# Patient Record
Sex: Male | Born: 1935 | Race: White | Hispanic: No | Marital: Single | Smoking: Never smoker
Health system: Southern US, Community
[De-identification: ages and names within clinical notes are randomized; demographics above are authoritative.]

## PROBLEM LIST (undated history)

## (undated) DIAGNOSIS — M1990 Unspecified osteoarthritis, unspecified site: Secondary | ICD-10-CM

## (undated) DIAGNOSIS — F0280 Dementia in other diseases classified elsewhere without behavioral disturbance: Secondary | ICD-10-CM

## (undated) DIAGNOSIS — F419 Anxiety disorder, unspecified: Secondary | ICD-10-CM

## (undated) DIAGNOSIS — I10 Essential (primary) hypertension: Secondary | ICD-10-CM

## (undated) DIAGNOSIS — E039 Hypothyroidism, unspecified: Secondary | ICD-10-CM

## (undated) DIAGNOSIS — G309 Alzheimer's disease, unspecified: Secondary | ICD-10-CM

## (undated) HISTORY — DX: Anxiety disorder, unspecified: F41.9

## (undated) HISTORY — DX: Alzheimer's disease, unspecified: G30.9

## (undated) HISTORY — DX: Hypothyroidism, unspecified: E03.9

## (undated) HISTORY — DX: Unspecified osteoarthritis, unspecified site: M19.90

## (undated) HISTORY — DX: Dementia in other diseases classified elsewhere, unspecified severity, without behavioral disturbance, psychotic disturbance, mood disturbance, and anxiety: F02.80

## (undated) HISTORY — DX: Essential (primary) hypertension: I10

---

## 2003-12-02 ENCOUNTER — Other Ambulatory Visit: Payer: Self-pay

## 2003-12-03 ENCOUNTER — Other Ambulatory Visit: Payer: Self-pay

## 2003-12-18 ENCOUNTER — Other Ambulatory Visit: Payer: Self-pay

## 2007-03-22 ENCOUNTER — Other Ambulatory Visit: Payer: Self-pay

## 2007-03-22 ENCOUNTER — Emergency Department: Payer: Self-pay | Admitting: Emergency Medicine

## 2007-03-28 ENCOUNTER — Other Ambulatory Visit: Payer: Self-pay

## 2007-03-28 ENCOUNTER — Inpatient Hospital Stay: Payer: Self-pay | Admitting: Internal Medicine

## 2010-08-16 ENCOUNTER — Inpatient Hospital Stay: Payer: Self-pay | Admitting: Internal Medicine

## 2010-08-16 ENCOUNTER — Ambulatory Visit: Payer: Self-pay | Admitting: Cardiovascular Disease

## 2010-08-17 ENCOUNTER — Encounter: Payer: Self-pay | Admitting: Cardiovascular Disease

## 2010-08-18 ENCOUNTER — Encounter: Payer: Self-pay | Admitting: Cardiovascular Disease

## 2010-08-31 ENCOUNTER — Emergency Department: Payer: Self-pay | Admitting: Emergency Medicine

## 2010-09-05 ENCOUNTER — Ambulatory Visit: Payer: Self-pay | Admitting: Cardiovascular Disease

## 2010-09-05 ENCOUNTER — Encounter: Payer: Self-pay | Admitting: Cardiovascular Disease

## 2010-09-05 DIAGNOSIS — I10 Essential (primary) hypertension: Secondary | ICD-10-CM

## 2010-09-05 DIAGNOSIS — R609 Edema, unspecified: Secondary | ICD-10-CM | POA: Insufficient documentation

## 2010-09-05 DIAGNOSIS — I4589 Other specified conduction disorders: Secondary | ICD-10-CM

## 2010-09-06 ENCOUNTER — Encounter: Payer: Self-pay | Admitting: Cardiovascular Disease

## 2010-09-20 ENCOUNTER — Encounter: Payer: Self-pay | Admitting: Cardiovascular Disease

## 2010-10-29 NOTE — Letter (Signed)
Summary: Discharge Summary  Discharge Summary   Imported By: [place] Carbo 08/23/2010 09:17:13  _____________________________________________________________________  External Attachment:    Type:   Image     Comment:   External Document  Appended Document: NP6/AMD Given severe LE edema, would hold amlodipine and start hydralazine 25 mg by mouth three times a day. [facility] [phone]. Monitor BP with check x 1 every day for two weeks. Fax numbers back to [place]

## 2010-10-31 NOTE — Letter (Signed)
Summary: BP Readings  BP Readings   Imported By: Harlon Flor 10/02/2010 15:21:33  _____________________________________________________________________  External Attachment:    Type:   Image     Comment:   External Document

## 2010-10-31 NOTE — Letter (Signed)
Summary: [place] [facility]   [place] [facility]   Imported By: Marylou Mccoy 09/27/2010 18:48:36  _____________________________________________________________________  External Attachment:    Type:   Image     Comment:   External Document

## 2010-10-31 NOTE — Letter (Signed)
Summary: Patient Receipt of Notice of Privacy Practices   Patient Receipt of Notice of Privacy Practices   Imported By: Marylou Mccoy 09/27/2010 18:54:53  _____________________________________________________________________  External Attachment:    Type:   Image     Comment:   External Document

## 2010-10-31 NOTE — Assessment & Plan Note (Signed)
Summary: NP6/AMD   Visit Type:  Initial Consult Primary Provider:  Dr. Dewaine Oats  CC:  f/u from [facility]. Denies chest pain, SOB, and and palpitations.  History of Present Illness: 77 year old gentleman with history of Alzheimer's dementia,hypertension, edema, anxiety, renal insufficiency, hard of hearing, presenting to [facility] August 16 2010 for bradycardia, weakness and lethargy with altered mental status.  His symptoms were felt secondary to being on the metoprolol. The metoprolol was held and his heart rate did improve. He was noted to be in a junctional rhythm, sometimes appearing to be a second-degree AV block. He was discharged after he was felt to be stable. He had no symptoms at the time of discharge. He presents today with his caretaker and reports that he does not have any problems. He denies any dizziness, lightheadedness. No falls.  Echocardiogram November 19 shows normal systolic function, normal left and right atria, normal RV function, overall a normal study  EKG today shows asinus rhythm with A-V dissociation,  junctional rhythm with ventricular rate 59 beats are minute, narrow complex  Current Medications (verified): 1)  Amlodipine Besylate 10 Mg Tabs (Amlodipine Besylate) .Marland Kitchen.. 1 Tablet Once Daily 2)  Torsemide 50 Mg/31ml Soln (Torsemide) .Marland Kitchen.. 1 Tablet Once Daily 3)  Synthroid 50 Mcg Tabs (Levothyroxine Sodium) .Marland Kitchen.. 1 Tablet Daily 4)  Loratadine 10 Mg Tabs (Loratadine) .Marland Kitchen.. 1 Tablet Daily 5)  Natures Tears  Soln (Artificial Tear Solution) .Marland Kitchen.. 1 Drop in Each Eye Two Times A Day 6)  Klonopin 1 Mg Tabs (Clonazepam) .Marland Kitchen.. 1 Tablet At Bedtime 7)  Lactose Fast Acting Relief 9000 Unit Tabs (Lactase) .Marland Kitchen.. 1 Tablet in Am 8)  Boost Plus  Liqd (Nutritional Supplements) .... As Needed When Not Eating Well 9)  Ted Hose  Allergies (verified): No Known Drug Allergies  Past History:  Past Medical History: Last updated: 09/05/2010 Alzheimer's  Disease Anxiety Arthritis Hypertension Hypothyroidism  Family History: Last updated: 09/05/2010 Unknown  Social History: Last updated: 09/05/2010 Retired  Single  Tobacco Use - No.  Alcohol Use - no Regular Exercise - no Drug Use - no  Risk Factors: Exercise: no (09/05/2010)  Risk Factors: Smoking Status: never (09/05/2010)  Past Surgical History: Unknown  Family History: Unknown  Review of Systems  The patient denies fever, weight loss, weight gain, vision loss, decreased hearing, hoarseness, chest pain, syncope, dyspnea on exertion, peripheral edema, prolonged cough, abdominal pain, incontinence, muscle weakness, depression, and enlarged lymph nodes.    Vital Signs:  Patient profile:   77 year old male Height:      69 inches Weight:      194.50 pounds BMI:     28.83 Pulse rate:   59 / minute BP sitting:   128 / 72  (left arm) Cuff size:   regular  Vitals Entered By: Lysbeth Galas CMA (September 05, 2010 3:51 PM)  Physical Exam  General:  elderly gentleman who is very hard of hearing and a poor historian. Head:  normocephalic and atraumatic Neck:  Neck supple, no JVD. No masses, thyromegaly or abnormal cervical nodes. Lungs:  Clear bilaterally to auscultation and percussion. Heart:  Non-displaced PMI, chest non-tender; regular rate and rhythm, S1, S2 without murmurs, rubs or gallops. Carotid upstroke normal, no bruit.  Pedals normal pulses. 2+, woody-type edema with signs of chronic venous stasis, small ulcers on the back of the right leg that are healing, , no varicosities. Abdomen:  Bowel sounds positive; abdomen soft and non-tender without masses Msk:  Back  normal, normal gait. Muscle strength and tone normal. Pulses:  pulses normal in all 4 extremities Extremities:  No clubbing or cyanosis. Neurologic:  Alert and oriented x 3. Skin:  Intact without lesions or rashes. Psych:  Normal affect.   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 We will show the EKGs to our electrophysiologist. I suspect that we will continue to monitor him closely and if he has additional symptoms, we could perform a Holter monitor.  His updated medication list for this problem includes:    Amlodipine Besylate 10 Mg Tabs (Amlodipine besylate) .Marland Kitchen... 1 tablet once daily  Problem # 2:  EDEMA (ICD-782.3) His edema is profound. I suggested he wear TED hose bilaterally when awake. I would like to wean him off his amlodipine given the complications of calcium channel blockers and edema though the options for alternate blood pressures are few. I do not want to start any ACE inhibitor or ARB given his renal insufficiency. We could start vomiting no there are rare cases of bradycardia. We could change this to hydralazine as he does live in an assisted facility and they could dose the medication for him. I would probably start 25 mg t.i.d. with titration if needed, and hold his amlodipine.  Problem # 3:  HYPERTENSION, BENIGN (ICD-401.1) blood pressure is well controlled. In an effort to decrease his edema, we will change the amlodipine to hydralazine.  His updated medication list for this problem includes:    Amlodipine Besylate 10 Mg Tabs (Amlodipine besylate) .Marland Kitchen... 1 tablet once daily    Torsemide 50 Mg/105ml Soln (Torsemide) .Marland Kitchen... 1 tablet once daily  Appended Document: NP6/AMD Given severe LE edema, would hold amlodipine and start hydralazine 25 mg by mouth three times a day. [facility] [phone]. Monitor BP with check x 1 every day for two weeks. Fax numbers back to [place]  Appended Document: NP6/AMD Spoke to pt at pt's assisted living facility. Notified of new orders, faxed orders as above to [phone]   Clinical Lists Changes  Medications: Removed medication of AMLODIPINE BESYLATE 10 MG TABS (AMLODIPINE BESYLATE) 1 tablet once daily Added new  medication of HYDRALAZINE HCL 25 MG TABS (HYDRALAZINE HCL) Take one tablet by mouth three times a day

## 2010-10-31 NOTE — Consult Note (Signed)
[facility]   Im[facility] By: Marylou Mccoy 09/27/2010 18:52:49  _____________________________________________________________________  External Attachment:    Type:   Image     Comment:   External Document

## 2010-10-31 NOTE — Miscellaneous (Signed)
Summary: Order  Order   Imported By: Harlon Flor 09/12/2010 14:56:56  _____________________________________________________________________  External Attachment:    Type:   Image     Comment:   External Document

## 2010-10-31 NOTE — Letter (Signed)
[facility]   Imported By: Marylou Mccoy 09/27/2010 18:49:35  _____________________________________________________________________  External Attachment:    Type:   Image     Comment:   External Document

## 2010-12-09 ENCOUNTER — Ambulatory Visit (INDEPENDENT_AMBULATORY_CARE_PROVIDER_SITE_OTHER): Payer: Medicare Other | Admitting: Cardiovascular Disease

## 2010-12-09 ENCOUNTER — Ambulatory Visit: Payer: Self-pay | Admitting: Cardiovascular Disease

## 2010-12-09 ENCOUNTER — Encounter: Payer: Self-pay | Admitting: Cardiovascular Disease

## 2010-12-09 DIAGNOSIS — I10 Essential (primary) hypertension: Secondary | ICD-10-CM

## 2010-12-09 DIAGNOSIS — I498 Other specified cardiac arrhythmias: Secondary | ICD-10-CM

## 2010-12-17 NOTE — Assessment & Plan Note (Signed)
Summary: 3 MONTH F/U/SAB   Visit Type:  Follow-up Primary Provider:  Dr. Dewaine Oats  CC:  3 month F/U. "Doing well.".  History of Present Illness: 77 year old gentleman with history of Alzheimer's dementia,hypertension, edema, anxiety, renal insufficiency, hard of hearing, presenting to [facility] August 16, 2010 for bradycardia, weakness and lethargy with altered mental status. His metoprolol was held. He returns for routine followup.  in the hospital, he was noted to be in a junctional rhythm, sometimes appearing to be a second-degree AV block. He was discharged after he was felt to be stable. He had no symptoms at the time of discharge.   He presents today with his caretaker and reports that he does not have any problems. He denies any dizziness, lightheadedness. No falls.he does not need a cane or walker.  Echocardiogram August 17 2010 shows normal systolic function, normal left and right atria, normal RV function, overall a normal study  EKG today shows sinus rhythm with second degree AV block1, unable to definitively exclude A-V dissociation with  junctional rhythm with ventricular rate 68 beats are minute  Current Medications (verified): 1)  Torsemide 50 Mg/25ml Soln (Torsemide) .Marland Kitchen.. 1 Tablet Once Daily 2)  Synthroid 50 Mcg Tabs (Levothyroxine Sodium) .Marland Kitchen.. 1 Tablet Daily 3)  Loratadine 10 Mg Tabs (Loratadine) .Marland Kitchen.. 1 Tablet Daily 4)  Natures Tears  Soln (Artificial Tear Solution) .Marland Kitchen.. 1 Drop in Each Eye Two Times A Day 5)  Klonopin 1 Mg Tabs (Clonazepam) .Marland Kitchen.. 1 Tablet At Bedtime 6)  Lactose Fast Acting Relief 9000 Unit Tabs (Lactase) .Marland Kitchen.. 1 Tablet in Am 7)  Boost Plus  Liqd (Nutritional Supplements) .... As Needed When Not Eating Well 8)  Ted Hose 9)  Hydralazine Hcl 25 Mg Tabs (Hydralazine Hcl) .... Take One Tablet By Mouth Three Times A Day 10)  Docusate Sodium 100 Mg Caps (Docusate Sodium) .... One Cap Once Daily  Allergies (verified): No Known Drug  Allergies  Past History:  Past Medical History: Last updated: 09/05/2010 Alzheimer's Disease Anxiety Arthritis Hypertension Hypothyroidism  Past Surgical History: Last updated: 09/05/2010 Unknown  Family History: Last updated: 09/05/2010 Unknown  Social History: Last updated: 09/05/2010 Retired  Single  Tobacco Use - No.  Alcohol Use - no Regular Exercise - no Drug Use - no  Risk Factors: Exercise: no (09/05/2010)  Risk Factors: Smoking Status: never (09/05/2010)  Review of Systems  The patient denies fever, weight loss, weight gain, vision loss, decreased hearing, hoarseness, chest pain, syncope, dyspnea on exertion, peripheral edema, prolonged cough, abdominal pain, incontinence, muscle weakness, depression, and enlarged lymph nodes.    Vital Signs:  Patient profile:   77 year old male Height:      69 inches Weight:      188 pounds BMI:     27.86 Pulse rate:   73 / minute BP sitting:   154 / 84  (left arm) Cuff size:   regular  Vitals Entered By: Bishop Dublin, CMA (December 09, 2010 11:20 AM)  Physical Exam  General:  elderly gentleman who is very hard of hearing and a poor historian. Head:  normocephalic and atraumatic Neck:  Neck supple, no JVD. No masses, thyromegaly or abnormal cervical nodes. Lungs:  Clear bilaterally to auscultation and percussion. Heart:  Non-displaced PMI, chest non-tender; regular rate and rhythm, S1, S2 with I/Vi SEM RSB, no rubs or gallops. Carotid upstroke normal, no bruit.  Pedals normal pulses. 2+, Trace woody-type edema with signs of chronic venous stasis , no varicosities.  Abdomen:  Bowel sounds positive; abdomen soft and non-tender without masses Msk:  Back normal, normal gait. Muscle strength and tone normal. Pulses:  pulses normal in all 4 extremities Extremities:  No clubbing or cyanosis. Neurologic:  Alert and oriented x 3. Skin:  Intact without lesions or rashes. Psych:  Normal affect.   Impression &  Recommendations:  Problem # 1:  AV DISSOCIATION (ICD-426.89) EKG concerning for A-V dissociation though clearly could be second-degree AV block type I. He is asymptomatic regardless and QRS complex is narrow. As he is asymptomatic, no further workup at this time. We held his metoprolol secondary to bradycardia.  His updated medication list for this problem includes:    Aspirin 81 Mg Tbec (Aspirin) .Marland Kitchen... Take one tablet by mouth daily  Problem # 2:  HYPERTENSION, BENIGN (ICD-401.1) Blood pressure on repeat was 140/80. No further medication changes made.  His updated medication list for this problem includes:    Torsemide 100 Mg Tabs (Torsemide) .Marland Kitchen... 1/2 tablet once daily.    Hydralazine Hcl 25 Mg Tabs (Hydralazine hcl) .Marland Kitchen... Take one tablet by mouth three times a day    Aspirin 81 Mg Tbec (Aspirin) .Marland Kitchen... Take one tablet by mouth daily  Problem # 3:  EDEMA (ICD-782.3) Edema has improved. It is mild. No further medication changes. He is refusing TED hose.  Patient Instructions: 1)  Your physician recommends that you schedule a follow-up appointment in: 6 months 2)  Your physician recommends that you continue on your current medications as directed. Please refer to the Current Medication list given to you today.

## 2011-06-05 ENCOUNTER — Encounter: Payer: Self-pay | Admitting: Cardiovascular Disease

## 2011-06-09 ENCOUNTER — Encounter: Payer: Self-pay | Admitting: Cardiovascular Disease

## 2011-06-09 ENCOUNTER — Ambulatory Visit (INDEPENDENT_AMBULATORY_CARE_PROVIDER_SITE_OTHER): Payer: Medicare Other | Admitting: Cardiovascular Disease

## 2011-06-09 DIAGNOSIS — R609 Edema, unspecified: Secondary | ICD-10-CM

## 2011-06-09 DIAGNOSIS — I4589 Other specified conduction disorders: Secondary | ICD-10-CM

## 2011-06-09 DIAGNOSIS — I10 Essential (primary) hypertension: Secondary | ICD-10-CM

## 2011-06-09 NOTE — Assessment & Plan Note (Signed)
We have asked [facility] to monitor his blood pressure and a daily basis for the next week. He reports having systolic pressures typically in the 120 range. No medication changes made at this time.

## 2011-06-09 NOTE — Patient Instructions (Signed)
You are doing well. No medication changes were made. Please call us if you have new issues that need to be addressed before your next appt.  We will call you for a follow up Appt. In 6 months  

## 2011-06-09 NOTE — Assessment & Plan Note (Signed)
No significant edema on today's visit. We'll continue his current medication regimen.

## 2011-06-09 NOTE — Assessment & Plan Note (Signed)
Rhythm today is a regular, narrow complex rhythm. Unable to exclude junctional rhythm. He is not symptomatic. No further changes made. Holding all beta blockers given previous bradycardia.

## 2011-06-09 NOTE — Progress Notes (Signed)
Patient ID: Carlos Frederick, male    DOB: [birth year], 77 y.o.   MRN: [id number]  HPI Comments: 77 year old gentleman with history of Alzheimer's dementia,hypertension, edema, anxiety, renal insufficiency, hard of hearing, presenting to [facility] August 16 2010 for bradycardia, weakness and lethargy with altered mental status. He presents today from [facility] for routine evaluation. Previous episode of bradycardia felt secondary to metoprolol, improved rate once this was held.  Overall he is doing well. He is active, walks to some degree. He has no complaints. No chest pain, no edema, no lightheadedness. He reports that his blood pressure is typically well controlled. It was elevated on today's visit.   Echocardiogram November 19 shows normal systolic function, normal left and right atria, normal RV function, overall a normal study   EKG today shows Suspected sinus rhythm  with ventricular rate 60 beats are minute, Significant baseline artifact. Unable to definitively exclude junctional rhythm, ST abnormality in lead aVF     Outpatient Encounter Prescriptions as of 06/09/2011  Medication Sig Dispense Refill  . Artificial Tear Solution (NATURES TEARS) SOLN Apply 1 drop to eye 2 (two) times daily.        Marland Kitchen aspirin 81 MG tablet Take 81 mg by mouth daily.        . Boost Plus (BOOST PLUS) LIQD Take by mouth as needed.        . clonazePAM (KLONOPIN) 1 MG tablet Take 1 mg by mouth at bedtime as needed.        . docusate sodium (COLACE) 100 MG capsule Take 100 mg by mouth daily as needed.        Marland Kitchen guaiFENesin-codeine (ROBITUSSIN AC) 100-10 MG/5ML syrup Take 5 mLs by mouth 4 (four) times daily as needed.        . hydrALAZINE (APRESOLINE) 25 MG tablet Take 25 mg by mouth 3 (three) times daily.        . Lactase (LACTOSE FAST ACTING RELIEF) 9000 UNITS TABS Take 1 tablet by mouth every morning.        Marland Kitchen levothyroxine (SYNTHROID, LEVOTHROID) 50 MCG tablet Take 50 mcg  by mouth daily.        Marland Kitchen loratadine (CLARITIN) 10 MG tablet Take 10 mg by mouth daily.        . TORSEMIDE PO Take 50 mg by mouth daily.            Review of Systems  Constitutional: Negative.   HENT: Negative.   Eyes: Negative.   Respiratory: Negative.   Cardiovascular: Negative.   Gastrointestinal: Negative.   Musculoskeletal: Negative.   Skin: Negative.   Neurological: Negative.   Hematological: Negative.   Psychiatric/Behavioral: Negative.   All other systems reviewed and are negative.    BP 199/69  Pulse 57  Ht 5\' 9"  (1.753 m)  Wt 200 lb (90.719 kg)  BMI 29.53 kg/m2 Repeat blood pressure was 148/85  Physical Exam  Nursing note and vitals reviewed. Constitutional: He is oriented to person, place, and time. He appears well-developed and well-nourished.  HENT:  Head: Normocephalic.  Nose: Nose normal.  Mouth/Throat: Oropharynx is clear and moist.  Eyes: Conjunctivae are normal. Pupils are equal, round, and reactive to light.  Neck: Normal range of motion. Neck supple. No JVD present.  Cardiovascular: Normal rate, regular rhythm, S1 normal, S2 normal, normal heart sounds and intact distal pulses.  Exam reveals no gallop and no friction rub.   No murmur heard. Pulmonary/Chest: Effort normal and breath  sounds normal. No respiratory distress. He has no wheezes. He has no rales. He exhibits no tenderness.  Abdominal: Soft. Bowel sounds are normal. He exhibits no distension. There is no tenderness.  Musculoskeletal: Normal range of motion. He exhibits no edema and no tenderness.  Lymphadenopathy:    He has no cervical adenopathy.  Neurological: He is alert and oriented to person, place, and time. Coordination normal.  Skin: Skin is warm and dry. No rash noted. No erythema.  Psychiatric: He has a normal mood and affect. His behavior is normal. Judgment and thought content normal.           Assessment and Plan

## 2012-06-01 ENCOUNTER — Emergency Department: Payer: Self-pay | Admitting: Emergency Medicine

## 2012-06-01 LAB — CBC WITH DIFFERENTIAL/PLATELET
Comment - H1-Com1: NORMAL
HCT: 44.8 % (ref 40.0–52.0)
MCH: 33.1 pg (ref 26.0–34.0)
MCHC: 33.8 g/dL (ref 32.0–36.0)
MCV: 98 fL (ref 80–100)
Platelet: 139 10*3/uL — ABNORMAL LOW (ref 150–440)
RDW: 13.1 % (ref 11.5–14.5)
Variant Lymphocyte - H1-Rlymph: 9 %

## 2012-06-01 LAB — COMPREHENSIVE METABOLIC PANEL
Alkaline Phosphatase: 70 U/L (ref 50–136)
Anion Gap: 6 — ABNORMAL LOW (ref 7–16)
Calcium, Total: 8.7 mg/dL (ref 8.5–10.1)
Co2: 32 mmol/L (ref 21–32)
EGFR (African American): 38 — ABNORMAL LOW
Osmolality: 292 (ref 275–301)
SGOT(AST): 30 U/L (ref 15–37)
Sodium: 145 mmol/L (ref 136–145)

## 2012-06-01 LAB — URINALYSIS, COMPLETE
Bilirubin,UR: NEGATIVE
Ketone: NEGATIVE
Ph: 6 (ref 4.5–8.0)
Protein: NEGATIVE
RBC,UR: <1 /HPF (ref 0–5)
Specific Gravity: 1.013 (ref 1.003–1.030)
Squamous Epithelial: NONE SEEN

## 2012-06-01 LAB — TROPONIN I: Troponin-I: <0.02 ng/mL

## 2012-06-04 ENCOUNTER — Ambulatory Visit: Payer: Self-pay | Admitting: Internal Medicine

## 2012-11-08 ENCOUNTER — Emergency Department: Payer: Self-pay | Admitting: Emergency Medicine

## 2012-11-08 LAB — URINALYSIS, COMPLETE
Bilirubin,UR: NEGATIVE
Ketone: NEGATIVE
Ph: 6 (ref 4.5–8.0)
RBC,UR: 42 /HPF (ref 0–5)
Specific Gravity: 1.015 (ref 1.003–1.030)
Squamous Epithelial: NONE SEEN
WBC UR: 3075 /HPF (ref 0–5)

## 2012-11-08 LAB — CBC WITH DIFFERENTIAL/PLATELET
Basophil #: 0.1 10*3/uL (ref 0.0–0.1)
Eosinophil #: 0.3 10*3/uL (ref 0.0–0.7)
Eosinophil %: 2.6 %
HCT: 43.1 % (ref 40.0–52.0)
HGB: 14.2 g/dL (ref 13.0–18.0)
MCHC: 33 g/dL (ref 32.0–36.0)
Monocyte #: 0.6 x10 3/mm (ref 0.2–1.0)
Monocyte %: 5.8 %
Neutrophil #: 3.3 10*3/uL (ref 1.4–6.5)
Platelet: 180 10*3/uL (ref 150–440)
RDW: 12.7 % (ref 11.5–14.5)

## 2012-11-08 LAB — COMPREHENSIVE METABOLIC PANEL
Alkaline Phosphatase: 71 U/L (ref 50–136)
Anion Gap: 4 — ABNORMAL LOW (ref 7–16)
Co2: 35 mmol/L — ABNORMAL HIGH (ref 21–32)
Creatinine: 1.68 mg/dL — ABNORMAL HIGH (ref 0.60–1.30)
EGFR (African American): 41 — ABNORMAL LOW
Glucose: 94 mg/dL (ref 65–99)
Potassium: 3.7 mmol/L (ref 3.5–5.1)
SGOT(AST): 24 U/L (ref 15–37)
SGPT (ALT): 17 U/L (ref 12–78)
Total Protein: 8.1 g/dL (ref 6.4–8.2)

## 2012-11-08 LAB — CK TOTAL AND CKMB (NOT AT ARMC): CK, Total: 57 U/L (ref 35–232)

## 2012-11-13 LAB — CULTURE, BLOOD (SINGLE)

## 2013-01-11 ENCOUNTER — Emergency Department: Payer: Self-pay | Admitting: Emergency Medicine

## 2013-01-11 LAB — COMPREHENSIVE METABOLIC PANEL
Alkaline Phosphatase: 66 U/L (ref 50–136)
Anion Gap: 3 — ABNORMAL LOW (ref 7–16)
BUN: 9 mg/dL (ref 7–18)
Bilirubin,Total: 0.4 mg/dL (ref 0.2–1.0)
Co2: 32 mmol/L (ref 21–32)
EGFR (African American): 46 — ABNORMAL LOW
EGFR (Non-African Amer.): 40 — ABNORMAL LOW
Glucose: 77 mg/dL (ref 65–99)
Osmolality: 273 (ref 275–301)
Potassium: 3.9 mmol/L (ref 3.5–5.1)
SGOT(AST): 23 U/L (ref 15–37)
Sodium: 138 mmol/L (ref 136–145)
Total Protein: 7.3 g/dL (ref 6.4–8.2)

## 2013-01-11 LAB — MAGNESIUM: Magnesium: 2 mg/dL

## 2013-01-11 LAB — CBC
HCT: 38.7 % — ABNORMAL LOW (ref 40.0–52.0)
HGB: 12.8 g/dL — ABNORMAL LOW (ref 13.0–18.0)
MCH: 32.2 pg (ref 26.0–34.0)
MCHC: 33.1 g/dL (ref 32.0–36.0)
MCV: 97 fL (ref 80–100)
RBC: 3.98 10*6/uL — ABNORMAL LOW (ref 4.40–5.90)
RDW: 13.3 % (ref 11.5–14.5)
WBC: 11.6 10*3/uL — ABNORMAL HIGH (ref 3.8–10.6)

## 2013-01-11 LAB — CK TOTAL AND CKMB (NOT AT ARMC)
CK, Total: 67 U/L (ref 35–232)
CK-MB: 1.5 ng/mL (ref 0.5–3.6)

## 2013-01-11 LAB — PRO B NATRIURETIC PEPTIDE: B-Type Natriuretic Peptide: 1678 pg/mL — ABNORMAL HIGH (ref 0–450)

## 2013-01-11 LAB — TSH: Thyroid Stimulating Horm: 3.19 u[IU]/mL

## 2013-04-29 ENCOUNTER — Ambulatory Visit: Payer: Self-pay | Admitting: Nurse Practitioner

## 2013-04-29 LAB — CBC
MCH: 32.3 pg (ref 26.0–34.0)
MCHC: 33.5 g/dL (ref 32.0–36.0)
MCV: 97 fL (ref 80–100)
Platelet: 137 10*3/uL — ABNORMAL LOW (ref 150–440)
RDW: 12.9 % (ref 11.5–14.5)
WBC: 21.3 10*3/uL — ABNORMAL HIGH (ref 3.8–10.6)

## 2013-04-30 ENCOUNTER — Inpatient Hospital Stay: Payer: Self-pay | Admitting: Internal Medicine

## 2013-04-30 LAB — URINALYSIS, COMPLETE
Blood: NEGATIVE
Glucose,UR: NEGATIVE mg/dL (ref 0–75)
Hyaline Cast: 8
Ketone: NEGATIVE
Ph: 6 (ref 4.5–8.0)
RBC,UR: 1 /HPF (ref 0–5)

## 2013-04-30 LAB — COMPREHENSIVE METABOLIC PANEL
Albumin: 3.3 g/dL — ABNORMAL LOW (ref 3.4–5.0)
Anion Gap: 4 — ABNORMAL LOW (ref 7–16)
BUN: 16 mg/dL (ref 7–18)
Calcium, Total: 8.5 mg/dL (ref 8.5–10.1)
Glucose: 116 mg/dL — ABNORMAL HIGH (ref 65–99)
Osmolality: 276 (ref 275–301)
Potassium: 3.7 mmol/L (ref 3.5–5.1)
Sodium: 137 mmol/L (ref 136–145)

## 2013-04-30 LAB — PRO B NATRIURETIC PEPTIDE: B-Type Natriuretic Peptide: 1962 pg/mL — ABNORMAL HIGH (ref 0–450)

## 2013-05-01 LAB — CBC WITH DIFFERENTIAL/PLATELET
Basophil #: 0.1 10*3/uL (ref 0.0–0.1)
Basophil %: 0.7 %
Eosinophil %: 0.1 %
HCT: 37.9 % — ABNORMAL LOW (ref 40.0–52.0)
HGB: 13 g/dL (ref 13.0–18.0)
Lymphocyte #: 7.8 10*3/uL — ABNORMAL HIGH (ref 1.0–3.6)
Lymphocyte %: 45.6 %
MCH: 32.7 pg (ref 26.0–34.0)
MCV: 96 fL (ref 80–100)
Monocyte #: 1.1 x10 3/mm — ABNORMAL HIGH (ref 0.2–1.0)
Neutrophil %: 47 %
Platelet: 137 10*3/uL — ABNORMAL LOW (ref 150–440)

## 2013-05-01 LAB — URINE CULTURE

## 2013-05-01 LAB — BASIC METABOLIC PANEL
Anion Gap: 6 — ABNORMAL LOW (ref 7–16)
BUN: 19 mg/dL — ABNORMAL HIGH (ref 7–18)
Calcium, Total: 8.9 mg/dL (ref 8.5–10.1)
Co2: 32 mmol/L (ref 21–32)
EGFR (Non-African Amer.): 31 — ABNORMAL LOW
Osmolality: 284 (ref 275–301)

## 2013-05-02 LAB — BASIC METABOLIC PANEL
Anion Gap: 4 — ABNORMAL LOW (ref 7–16)
Co2: 34 mmol/L — ABNORMAL HIGH (ref 21–32)
Creatinine: 1.71 mg/dL — ABNORMAL HIGH (ref 0.60–1.30)
EGFR (Non-African Amer.): 35 — ABNORMAL LOW
Glucose: 93 mg/dL (ref 65–99)
Potassium: 3.2 mmol/L — ABNORMAL LOW (ref 3.5–5.1)
Sodium: 143 mmol/L (ref 136–145)

## 2013-05-02 LAB — CBC WITH DIFFERENTIAL/PLATELET
Comment - H1-Com2: NORMAL
HGB: 13.4 g/dL (ref 13.0–18.0)
MCHC: 33.5 g/dL (ref 32.0–36.0)
MCV: 96 fL (ref 80–100)
RBC: 4.19 10*6/uL — ABNORMAL LOW (ref 4.40–5.90)
RDW: 13.2 % (ref 11.5–14.5)
Segmented Neutrophils: 26 %

## 2013-05-03 LAB — BASIC METABOLIC PANEL
Anion Gap: 2 — ABNORMAL LOW (ref 7–16)
BUN: 21 mg/dL — ABNORMAL HIGH (ref 7–18)
Calcium, Total: 9.4 mg/dL (ref 8.5–10.1)
Chloride: 109 mmol/L — ABNORMAL HIGH (ref 98–107)
EGFR (African American): 45 — ABNORMAL LOW
EGFR (Non-African Amer.): 39 — ABNORMAL LOW
Glucose: 96 mg/dL (ref 65–99)
Sodium: 145 mmol/L (ref 136–145)

## 2013-05-03 LAB — CBC WITH DIFFERENTIAL/PLATELET
Basophil #: 0 10*3/uL (ref 0.0–0.1)
Basophil %: 0.3 %
Eosinophil %: 1.2 %
Lymphocyte #: 10.6 10*3/uL — ABNORMAL HIGH (ref 1.0–3.6)
MCH: 32.5 pg (ref 26.0–34.0)
MCHC: 33.7 g/dL (ref 32.0–36.0)
Monocyte #: 0.7 x10 3/mm (ref 0.2–1.0)
Monocyte %: 4.3 %
Neutrophil %: 26.1 %
RDW: 13.4 % (ref 11.5–14.5)

## 2013-05-04 LAB — CBC WITH DIFFERENTIAL/PLATELET
HGB: 13.6 g/dL (ref 13.0–18.0)
MCH: 32.3 pg (ref 26.0–34.0)
MCHC: 33.3 g/dL (ref 32.0–36.0)
MCV: 97 fL (ref 80–100)
RDW: 13.4 % (ref 11.5–14.5)
WBC: 13.1 10*3/uL — ABNORMAL HIGH (ref 3.8–10.6)

## 2013-05-04 LAB — BASIC METABOLIC PANEL
BUN: 22 mg/dL — ABNORMAL HIGH (ref 7–18)
Chloride: 112 mmol/L — ABNORMAL HIGH (ref 98–107)
Creatinine: 1.5 mg/dL — ABNORMAL HIGH (ref 0.60–1.30)
EGFR (African American): 47 — ABNORMAL LOW
Glucose: 140 mg/dL — ABNORMAL HIGH (ref 65–99)
Osmolality: 300 (ref 275–301)

## 2013-05-05 LAB — CULTURE, BLOOD (SINGLE)

## 2013-05-30 DEATH — deceased

## 2013-10-03 IMAGING — CR DG CHEST 1V PORT
1 series · 2 of 2 positions shown · non-contrast
Comparison: none

REASON FOR EXAM: hypoxia, chf exacerbation
COMMENTS:

PROCEDURE:     DXR - DXR PORTABLE CHEST SINGLE VIEW  - January 11, 2013  [DATE]
RESULT:     Comparison: 11/08/2012, 06/01/2012

[Series 1: ap · 0.17mm/px · 2 of 2 slices shown]
[im 1/2]
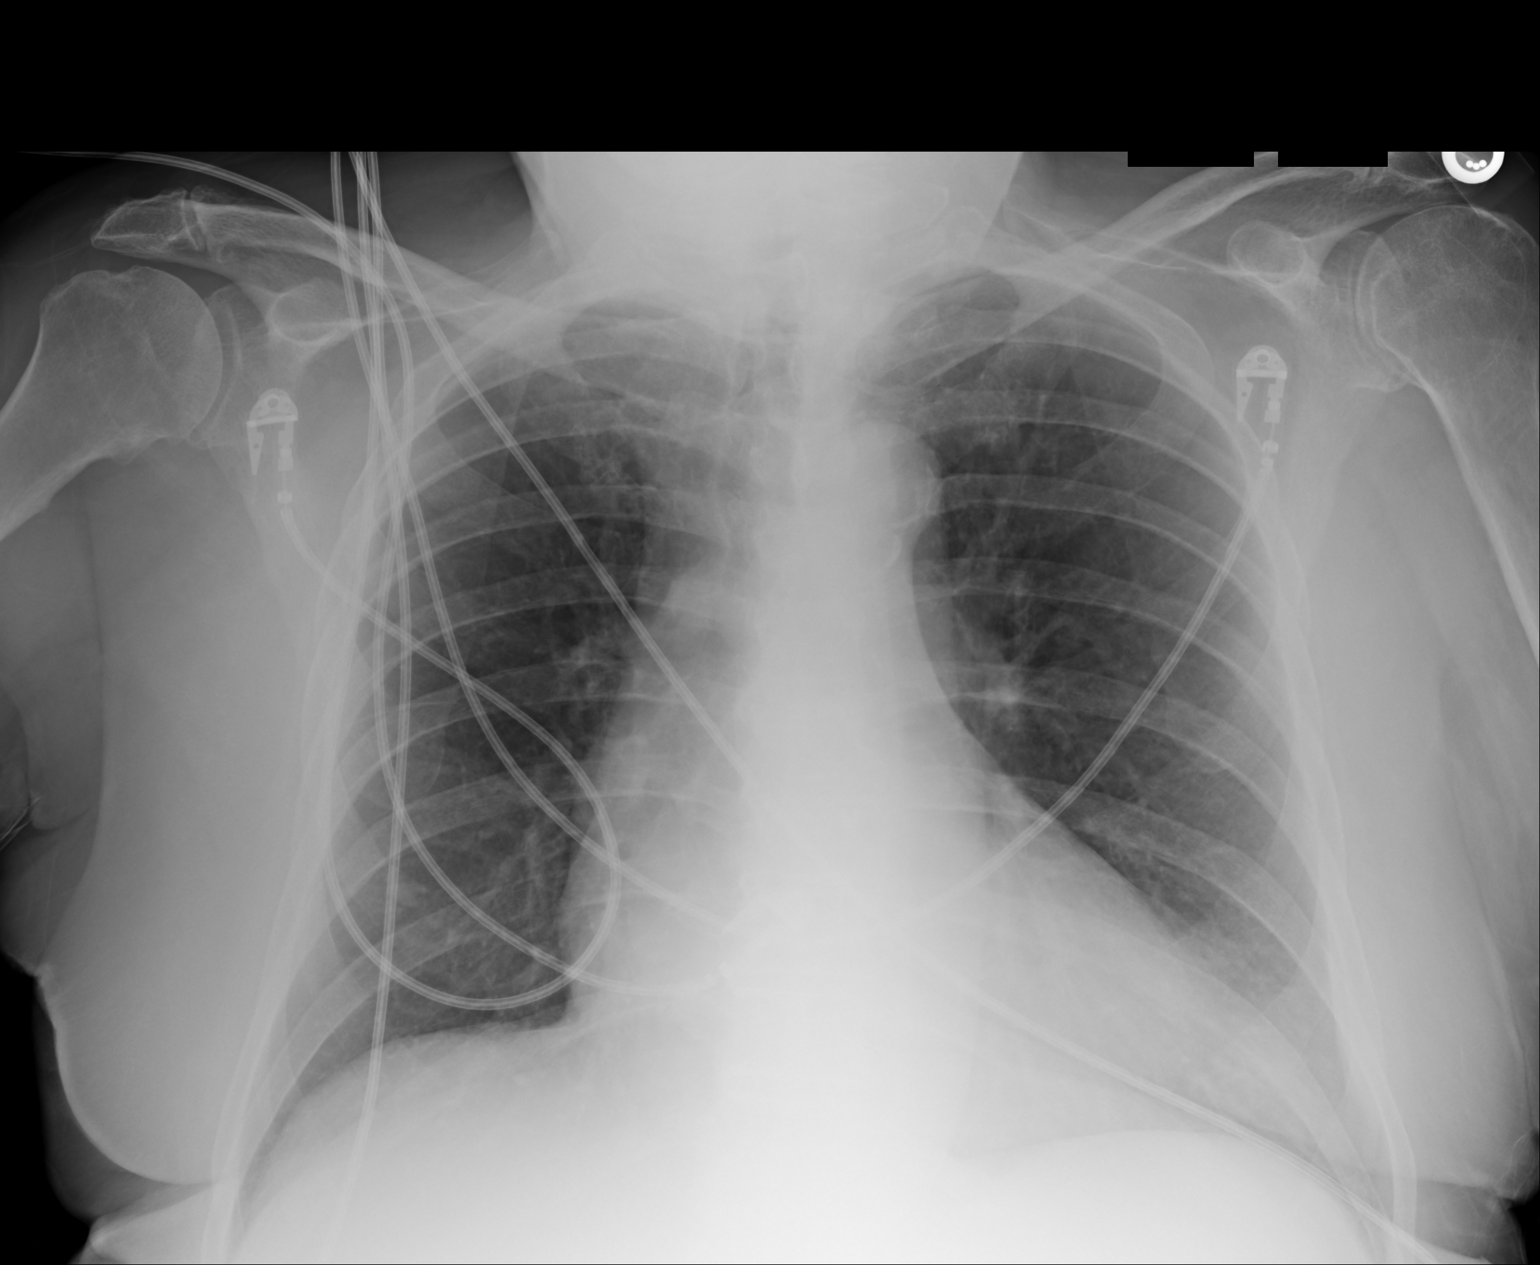
[im 2/2]
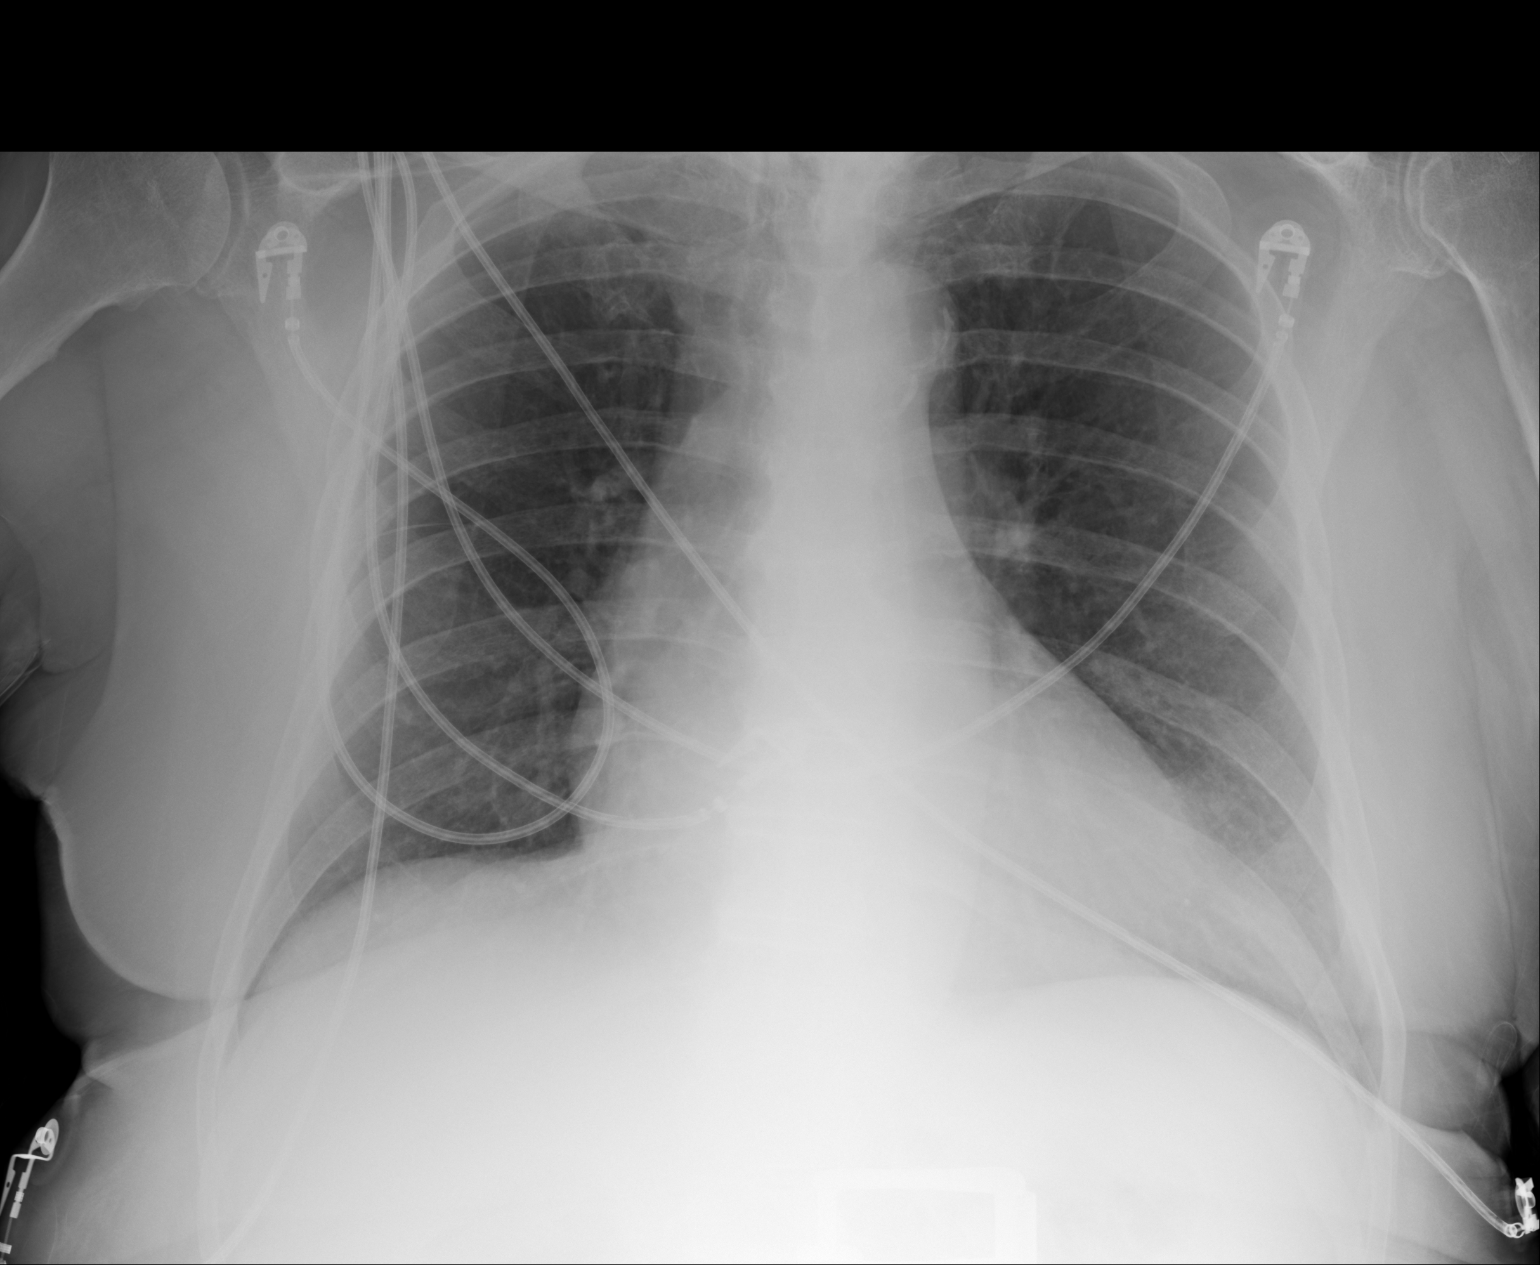

[2 of 2 positions shown; findings below may reference images not displayed]

FINDINGS: The heart is upper limits of normal, similar to prior. There is a subtle,
subcentimeter nodular density overlying the right lower lung between the
posterior right eighth and ninth ribs. Otherwise, no focal pulmonary
opacities.
IMPRESSION: 1. No acute cardiopulmonary disease.
2. Subtle, subcentimeter nodular density overlies the right lower lung.
Recommend followup PA and lateral chest radiograph.

[REDACTED]

## 2013-10-03 IMAGING — US US EXTREM LOW VENOUS BILAT
1 series · 14 of 24 positions shown · non-contrast
Comparison: none

REASON FOR EXAM: leg swelling, not responsive to lasix
COMMENTS:

PROCEDURE:     US  - US DOPPLER LOW EXTR BILATERAL  - January 11, 2013  [DATE]
RESULT:     Comparison: None

[Series 1: us extrem low venous bilat · 0.11mm/px · 14 of 56 slices shown]
[im 1/56]
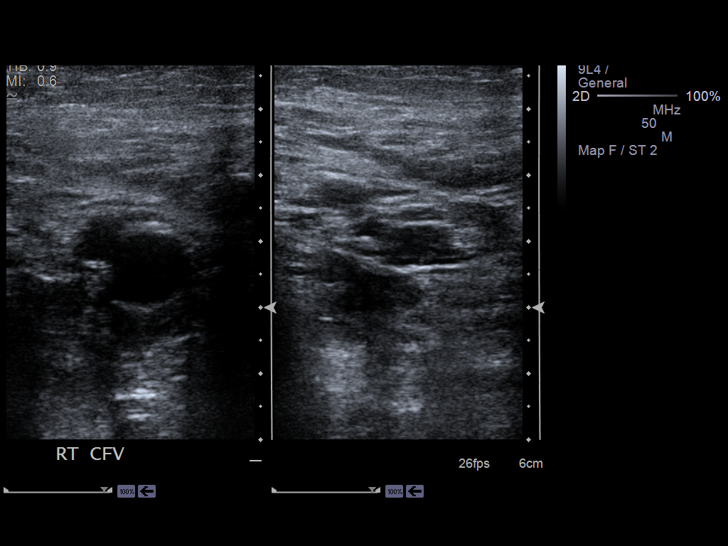
[im 5/56]
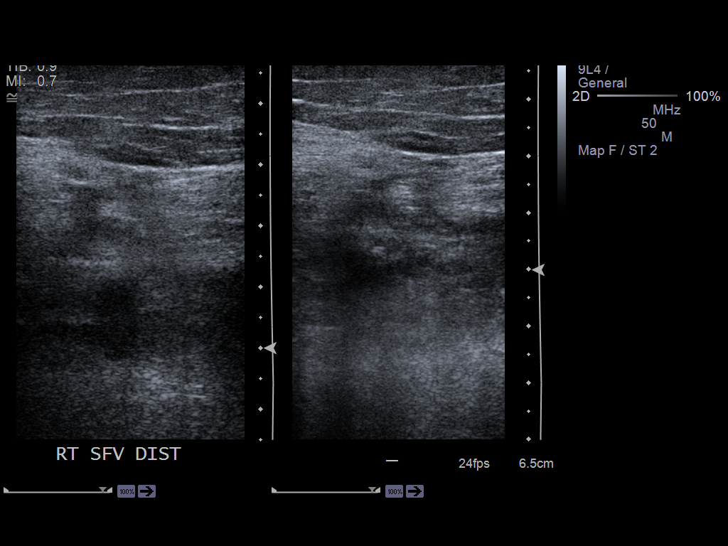
[im 10/56]
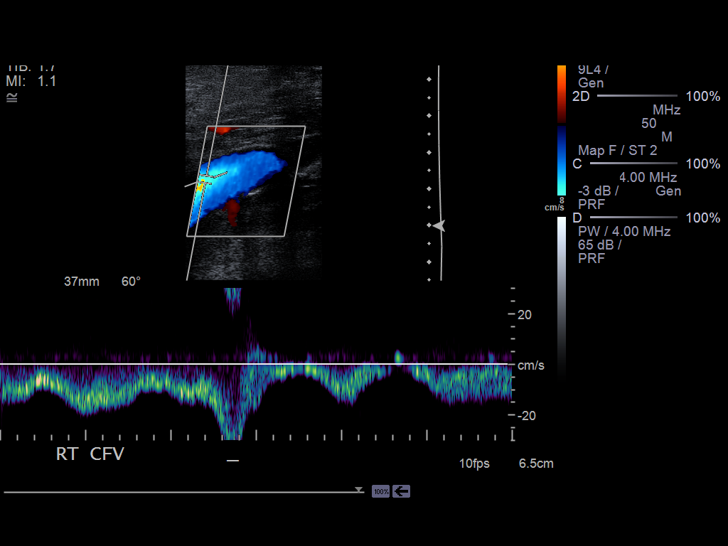
[im 15/56]
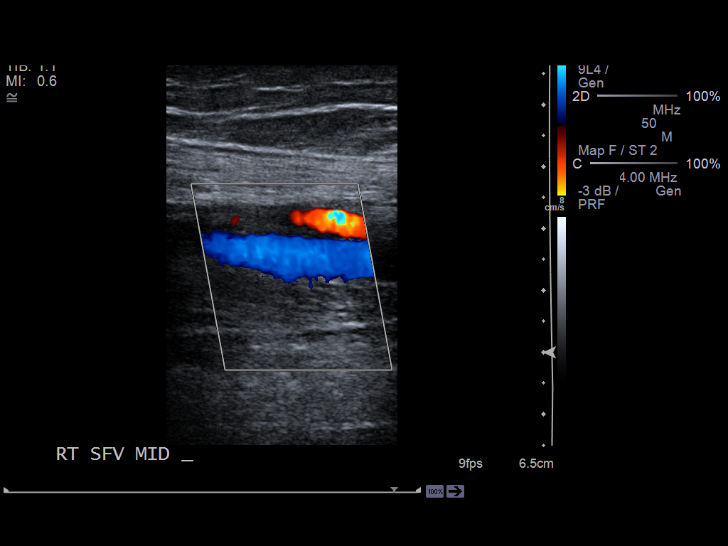
[im 17/56]
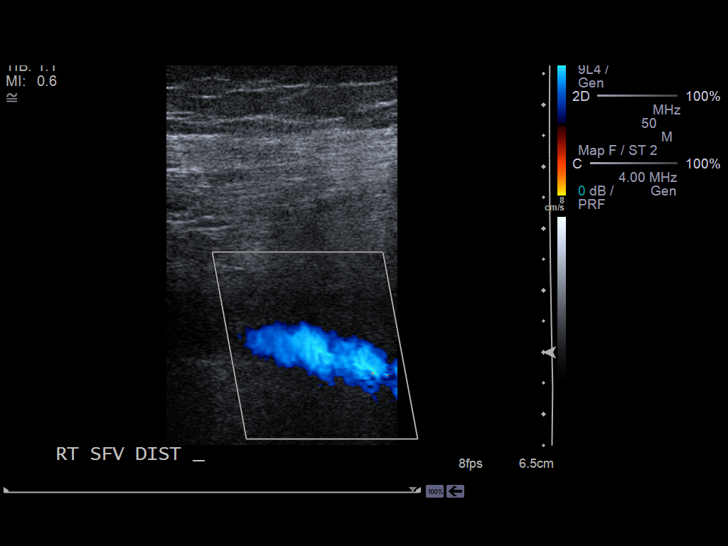
[im 22/56]
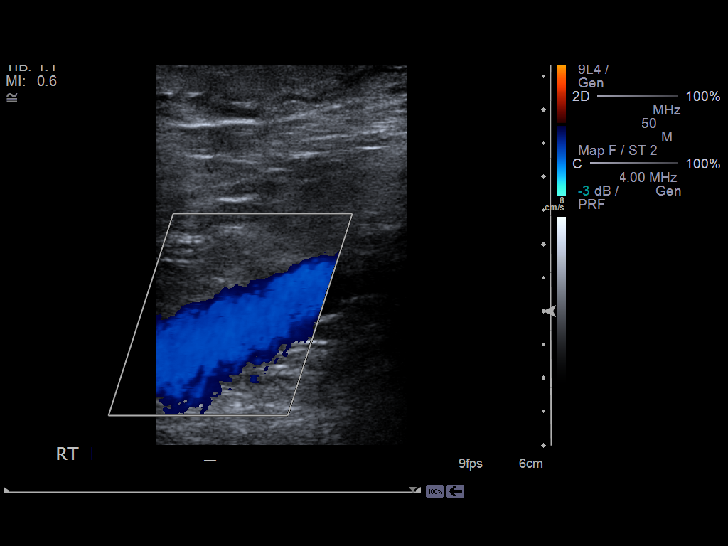
[im 27/56]
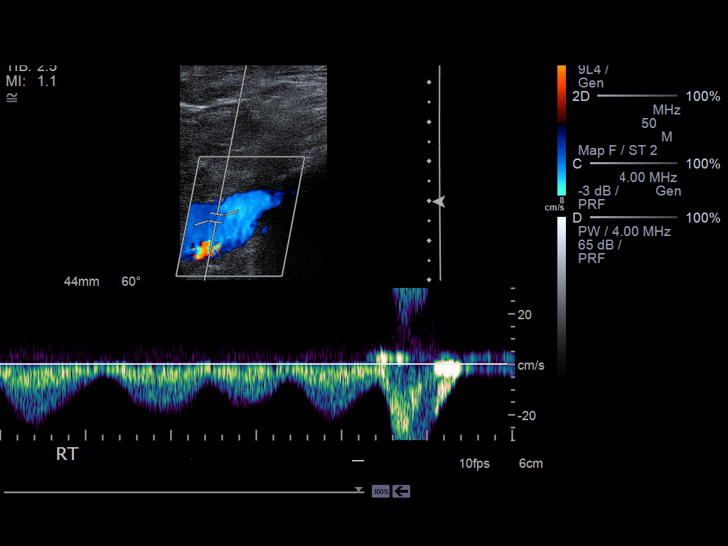
[im 29/56]
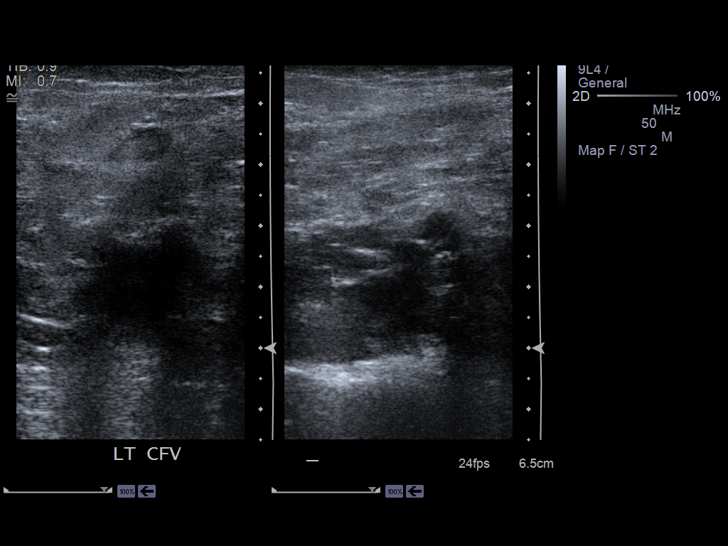
[im 34/56]
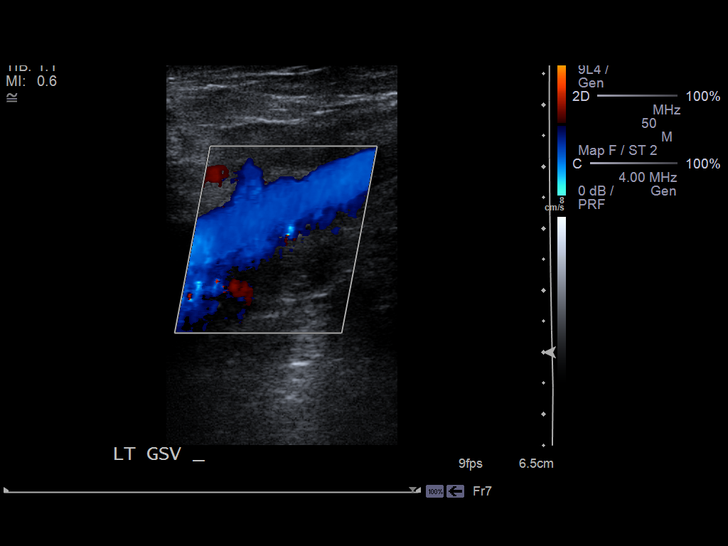
[im 39/56]
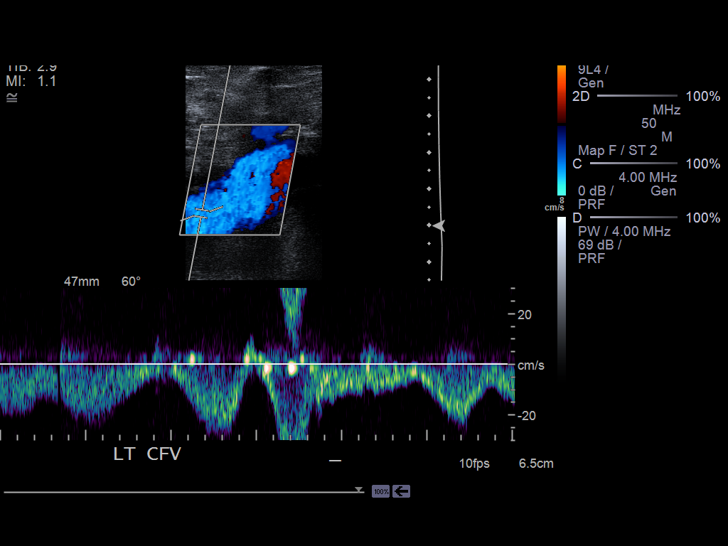
[im 44/56]
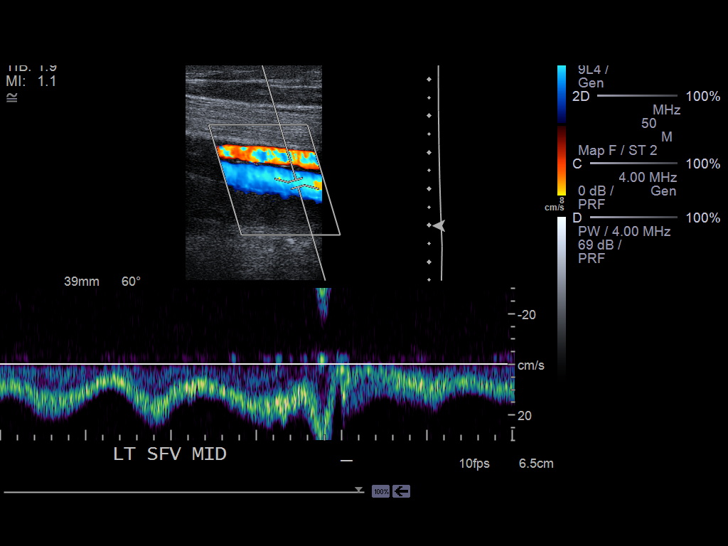
[im 46/56]
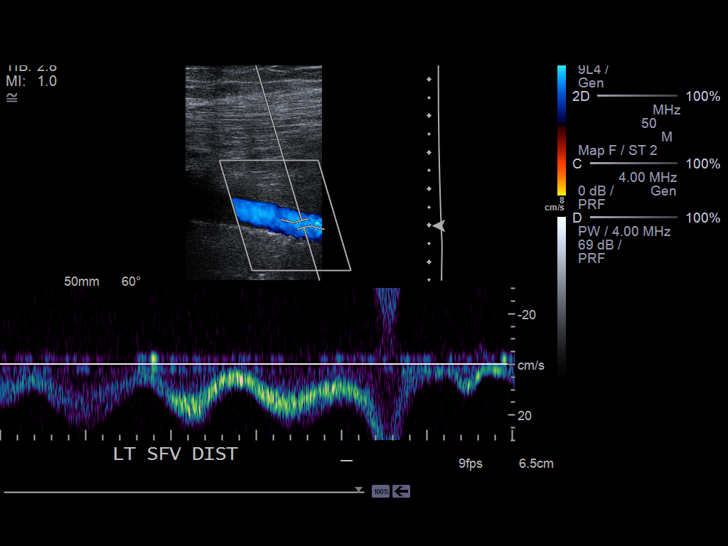
[im 51/56]
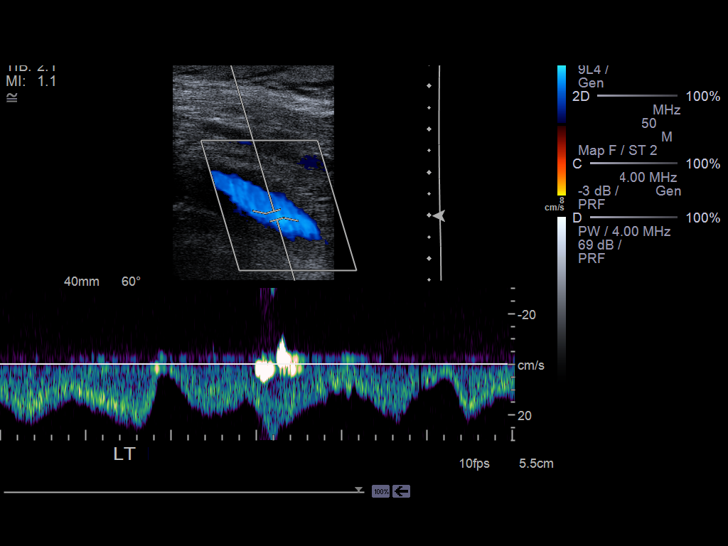
[im 56/56]
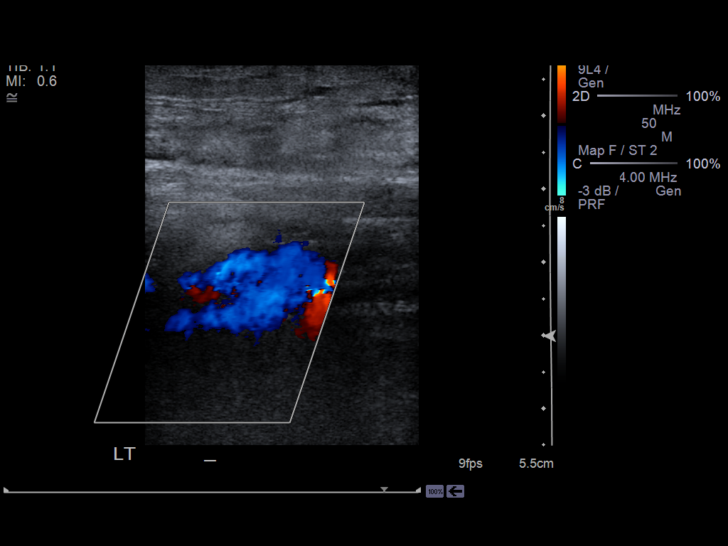

[14 of 24 positions shown; findings below may reference images not displayed]

FINDINGS: Multiple longitudinal and transverse gray-scale as well as color
and spectral Doppler images of the bilateral lower extremity veins were
obtained from the common femoral veins through the popliteal veins.

The bilateral common femoral, femoral, and popliteal veins are patent,
demonstrating normal color-flow and compressibility. No intraluminal
thrombus is identified.There is normal respiratory variation and
augmentation demonstrated at all vein levels.
IMPRESSION: No evidence of DVT in the bilateral lower extremities.

[REDACTED]

## 2014-01-19 IMAGING — CR DG CHEST 2V
1 series · 2 of 2 positions shown · non-contrast
Comparison: none

REASON FOR EXAM: SOB
COMMENTS:

PROCEDURE:     DXR - DXR CHEST PA (OR AP) AND LATERAL  - April 29, 2013 [DATE]
RESULT:     The lungs are clear. The cardiac silhouette and visualized bony
skeleton are unremarkable.

[Series 1: ap · 0.17mm/px · 2 of 2 slices shown]
[im 1/2]
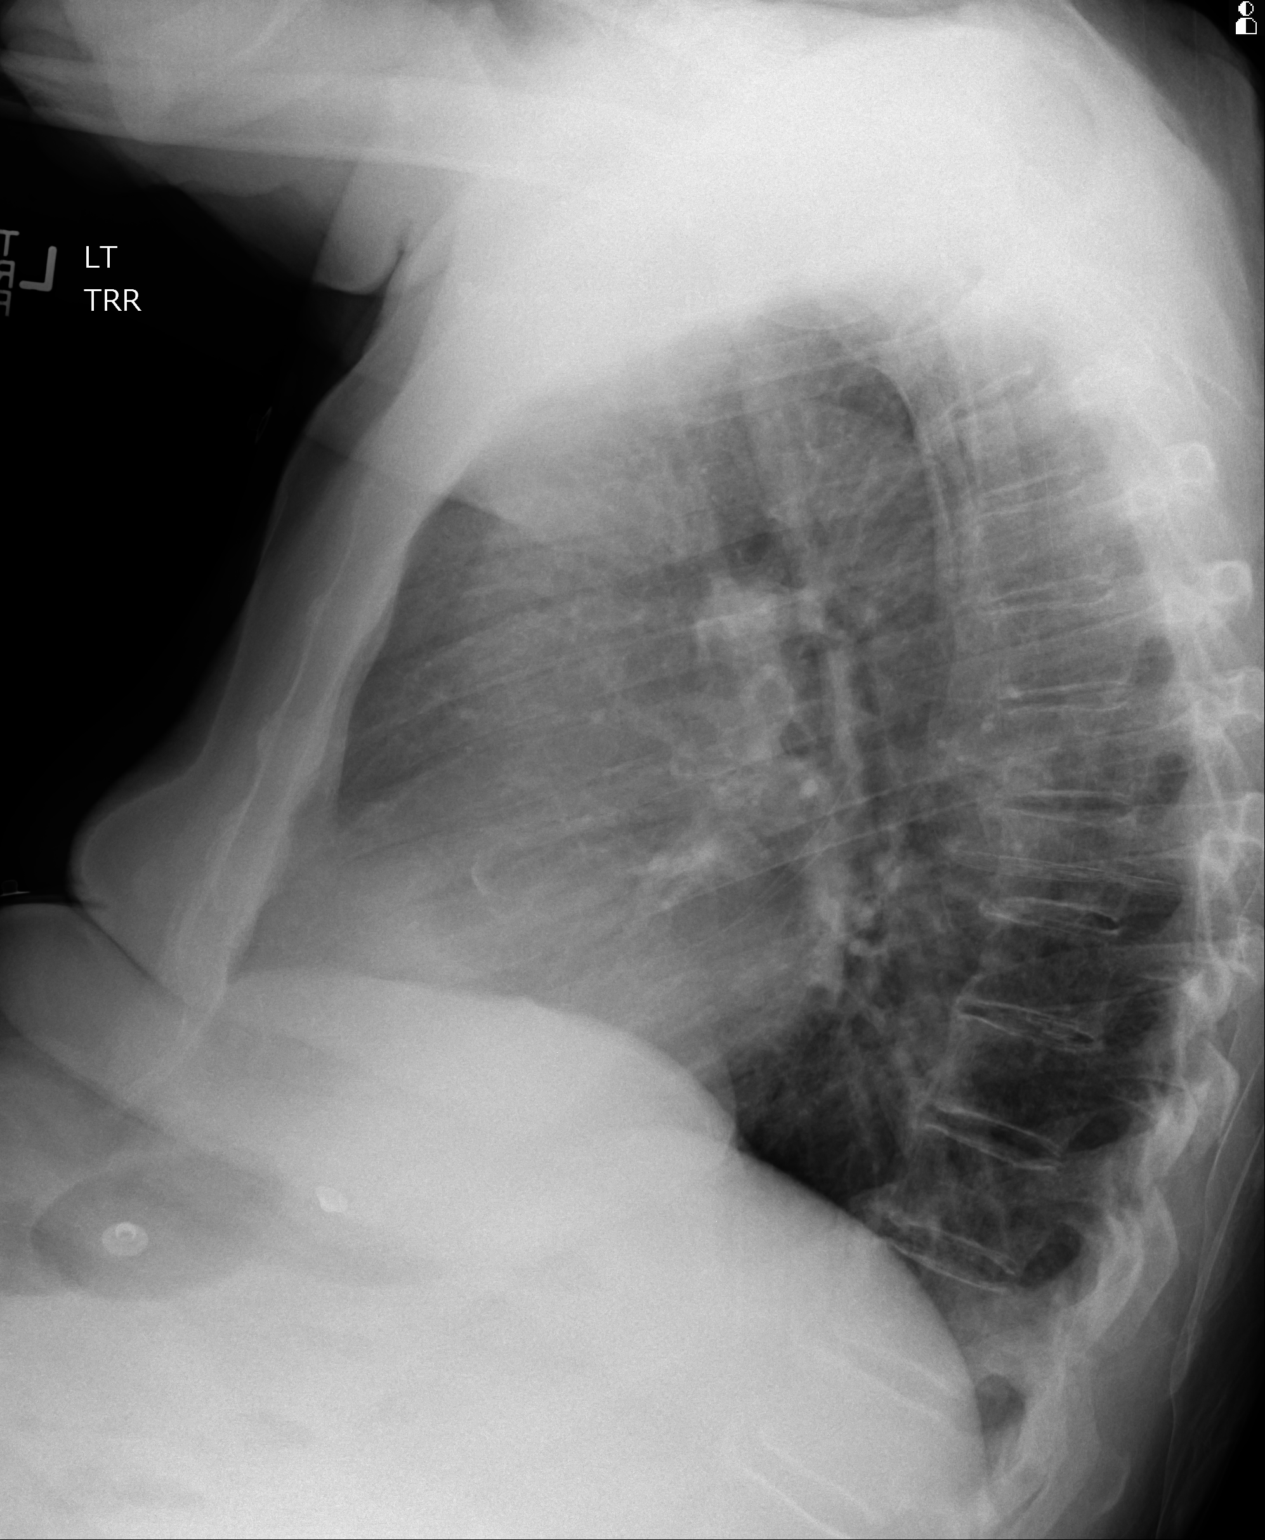
[im 2/2]
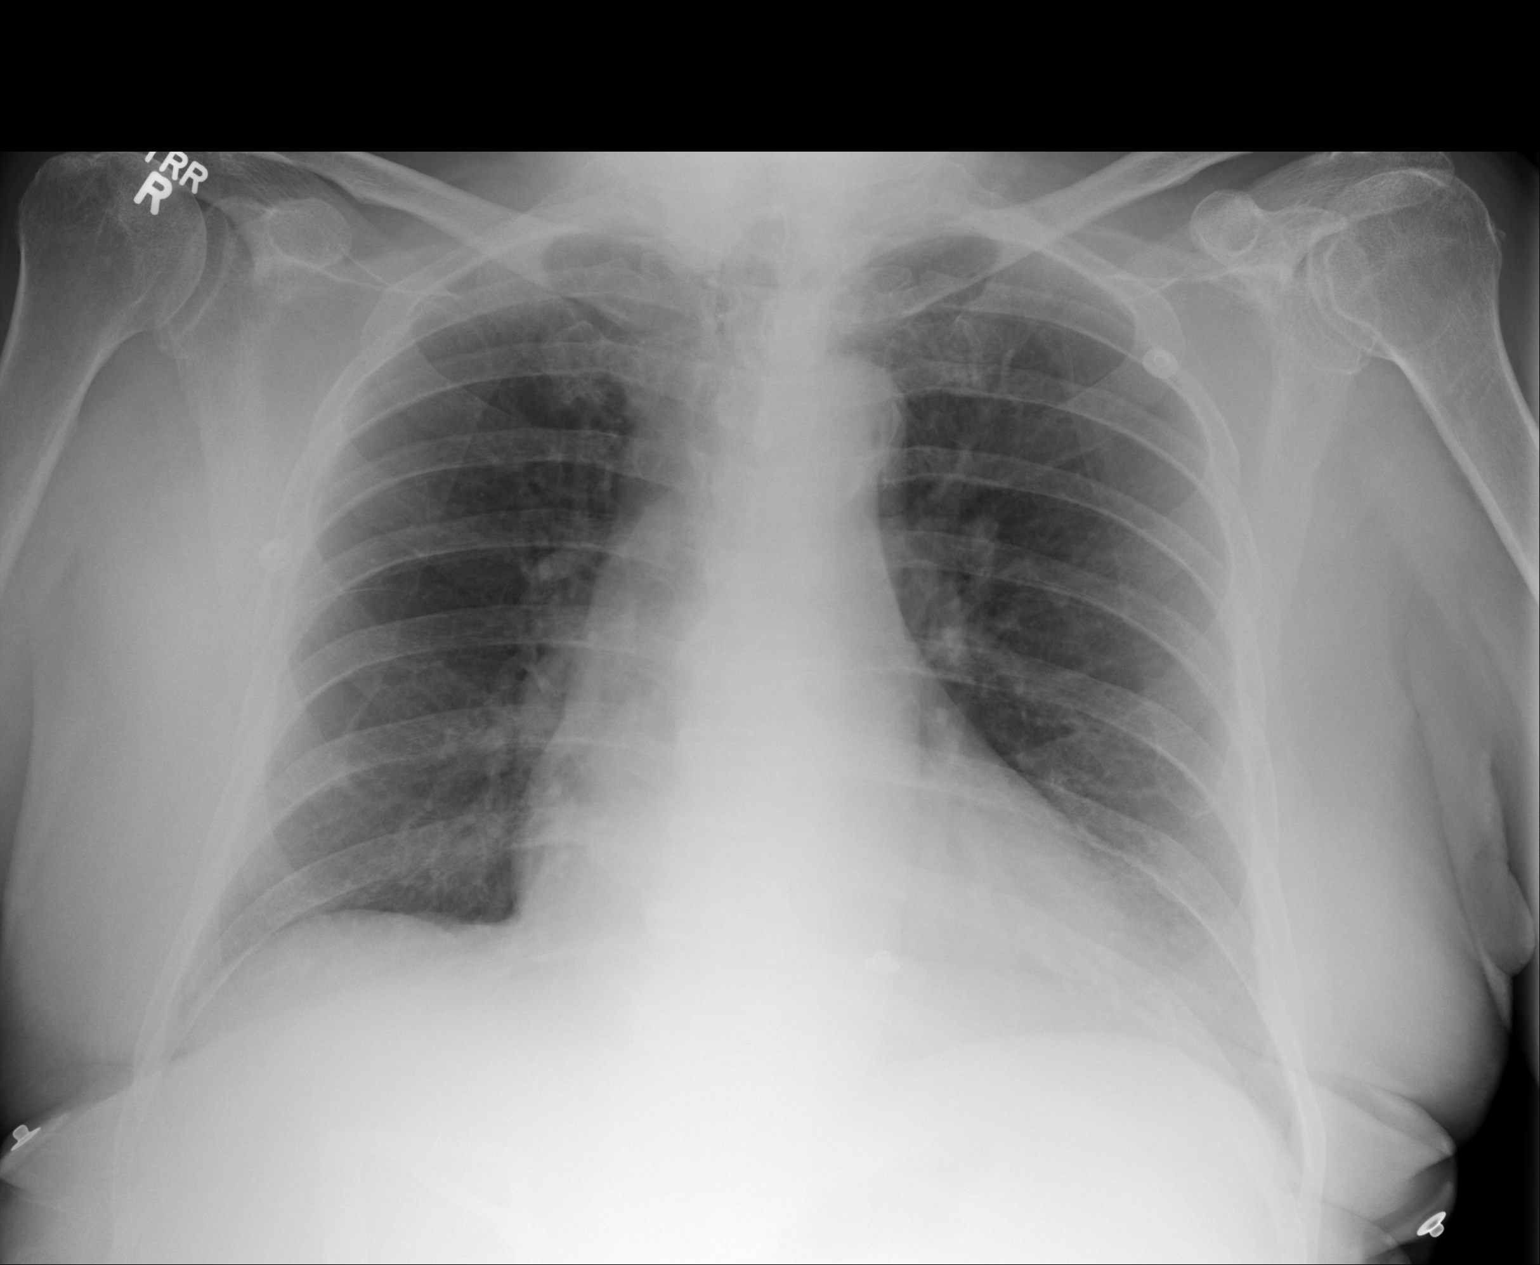

[2 of 2 positions shown; findings below may reference images not displayed]

IMPRESSION: 1. Chest radiograph without evidence of acute cardiopulmonary disease.
2. Comparison made to prior study dated 01/11/2013.

## 2015-01-19 NOTE — Discharge Summary (Signed)
PATIENT NAME:  Carlos Frederick, Carlos Frederick MR#:  [id number] DATE OF BIRTH:  May 04, 1922  DATE OF ADMISSION:  04/30/2013 DATE OF DISCHARGE:  05/04/2013  PRESENTING COMPLAINT: Shortness of breath and leg swelling.   DISCHARGE DIAGNOSES 1.  Leg swelling with edema resolved with IV Lasix.  2.  Mild lower extremity cellulitis, improved.  3.  Leukocytosis, improved.  4.  Advanced Alzheimer's dementia.  5.  Hypertension.  6.  Chronic kidney disease, stage 3.  7.  Hypothyroidism.   CODE STATUS: Full code.   DISCHARGE INSTRUCTIONS 1.  A 2 gram sodium diet. Please add Ensure 240 mL b.i.d.  2.  Physical therapy.  3.  Fall risk prevention.   MEDICATIONS AT DISCHARGE 1.  Aspirin 81 mg daily.  2.  Klonopin 1 mg at bedtime.  3.  Levothyroxine 0.05 mg daily.  4.  Hydralazine 25 mg t.i.d.  5.  Tylenol 650 q.6 p.r.n.  6.  Megace 400 p.o. daily.  7.  MiraLax 17 grams orally daily.  8.  Lactulose 30 mL b.i.d. p.r.n.  9.  Augmentin 875 b.i.d. for 3 more days.  10.  Torsemide 50 mg daily.  11.  Docusate 100 mg daily.  12.  Refresh classic ophthalmic drops, 1 drop both eyes b.i.d.    LABS AT DISCHARGE 1.  White count 13,000, Frederick and Frederick is 13.8 and 40.9, platelet count 163. Glucose is 140, BUN is 22, creatinine is 1.5, sodium is 148, potassium is 3.7, chloride 112. Blood cultures negative in 48 hours.  2.  Echo Doppler showed EF of 50% to 55%, normal global LV systolic function, mild thickening of anterior and posterior mitral leaflets, mild aortic valve sclerosis without stenosis, mild tricuspid regurgitation.  3.  UA negative for UTI.  Urine culture no growth.  4.  Chest x-ray: No acute cardiopulmonary abnormality.  5.  Troponin negative.  6.  EKG showed atrial fibrillation with slowed ventricular response.   BRIEF SUMMARY OF HOSPITAL COURSE: Carlos Frederick is an 81 year old Caucasian gentleman with history of Alzheimer's dementia, hypothyroidism and hypertension, who comes in from nursing home after he was  noted to have progressive increased leg edema and shortness of breath. He was found to have elevated blood pressure and leukocytosis with 21,000. He was admitted with:  1.  Increasing leg edema with shortness of breath. The patient did not appear to be in florid heart failure. His EF is 50% to 55%. He was given some extra doses of Lasix, diuresed well. His leg swelling has improved remarkably. The patient's saturations are 93% on room air.  The patient is placed back on his torsemide 50 mg daily.  2.  Hypertension. Hydralazine was resumed. Blood pressure remains stable and torsemide was resumed too.  3.  Bilateral lower extremity chronic venous stasis changes with possible underlying cellulitis with elevated white count. No other source of any infection was identified. The patient was started on IV Unasyn. White count came down from 21,000 to 13,000. The patient remained afebrile. The patient will finish up a course of Augmentin for a total 7 to 10 days.  4.  Advanced Alzheimer's dementia. The patient did require a sitter in the room for his intermittent confusion. He was taking clonazepam at bedtime.  5.  Hypothyroidism. Synthroid was continued.  6.  Nutrition. The patient initially did have a poor appetite; however, his appetite was labile, Megace trial was given. The patient did improve a little bit with his diet. Ensure was also prescribed which he will continue  at the nursing home.  7.  The patient remained a full code.   Carlos Frederick, the patient's sister-in-law, was updated on the patient's discharge.   TIME SPENT: 40 minutes.   ____________________________ Wylie Hail Allena Katz, MD sap:cs D: 05/04/2013 13:22:06 ET T: 05/04/2013 14:30:44 ET JOB#: [id number]  cc: Holten Spano A. Allena Katz, MD, <Dictator> Willow Ora MD ELECTRONICALLY SIGNED 05/19/2013 7:30

## 2015-01-19 NOTE — H&P (Signed)
PATIENT NAME:  Carlos Frederick, Carlos Frederick MR#:  [id number] DATE OF BIRTH:  05/21/1922  DATE OF ADMISSION:  04/30/2013  PRIMARY CARE PHYSICIAN:  Dr. Dewaine Oatsenny Tate.   REFERRING PHYSICIAN:  Dr. Lucrezia EuropeAllison Webster.   CHIEF COMPLAINT:  Referred from nursing home for evaluation of increased leg edema and noted to have increased shortness of breath.   HISTORY OF PRESENT ILLNESS:  Carlos Frederick is an 81 year old Caucasian male, nursing home resident, with advanced Alzheimer dementia.  He does not give any meaningful history, was referred by the nursing home when they noted that he has a progressive increase in lower leg edema bilaterally associated with increased shortness of breath.  Here, at the Emergency Department, he was noted to have elevated blood pressure reaching 191/61 and there is evidence of leukocytosis with a white blood cell count reaching 21,000, but no evidence of infection.    REVIEW OF SYSTEMS:  A 10 point system review is unobtainable due to patient's dementia.   PAST MEDICAL HISTORY:  Alzheimer dementia, systemic hypertension, anxiety, osteoarthritis and history of systemic inflammatory response syndrome.   SOCIAL HABITS:  Unobtainable from the patient, but according to the medical records, there is no history of smoking or alcohol abuse in the past 30 years.   SOCIAL HISTORY:  The patient is retired, now he has advanced dementia and resides at a local nursing home.   FAMILY HISTORY:  Unobtainable from the patient due to his advanced dementia.   PAST SURGICAL HISTORY:  Unknown.  Again, the patient has advanced dementia.   ADMISSION MEDICATIONS:  These include torsemide 100 mg taking 1/2 tablet a day, levothyroxine 50 mcg once a day, hydralazine 25 mg 3 times a day, Colace as needed, clonazepam 1 mg at bedtime, aspirin 81 mg a day, Artificial Tears ophthalmic.  Tylenol as needed.   ALLERGIES:  No known drug allergies.   PHYSICAL EXAMINATION: VITAL SIGNS:  Blood pressure 191/61.  Follow-up was  176/58.  Respiratory rate 20, pulse 60, temperature 98.9.  Oxygen saturation on room air was 95%.  GENERAL APPEARANCE:  Elderly male lying in bed in no acute distress.  He has total dementia and does not make sense.  He took off his clothes and trying to get out of the stretcher.  HEAD AND NECK:  No pallor.  No icterus.  No cyanosis.  Ear examination, could not assess hearing due to his dementia, but grossly he hear my words and he might repeat what I say.  No visible lesions.  No discharge.  Examination of the nose showed no discharge, no bleeding, no ulcers.  Examination of the mouth showed normal lips.  I cannot see adequately inside the mouth as the patient is uncooperative.  Eye examination revealed normal eyelids.  There is a little crusting on the right eyelid, normal conjunctivae.  I cannot assess the pupils as patient is resisting examination.  NECK:  Supple.  Trachea at midline.  No thyromegaly.  No cervical lymphadenopathy.  HEART:  Revealed normal S1, S2.  No S3, S4.  No murmur.  No gallop.  No carotid bruits.  LUNGS:  Revealed normal breathing pattern without use of accessory muscles.  No rales.  No wheezing.  ABDOMEN:  Soft without tenderness.  No rebound, no rigidity.  No guarding.  SKIN:  Revealed no ulcers.  No subcutaneous nodules.  There is a skin rash and brown pigmentation on both lower extremities from the ankle two-thirds up to the knee.  This is bilateral from chronic venous  stasis.  There is peripheral edema as well.  MUSCULOSKELETAL:  No joint swelling.  No clubbing.  NEUROLOGIC:  Cranial nerves II through XII were intact.  No focal motor deficit.  The patient is moving his upper and lower extremities.  There is no facial asymmetry.  His tongue appears to be central.  He mumbles a few words, but does not appear to have any slurred speech.  PSYCHIATRIC:  The patient has advanced dementia.  He is slightly restless, but not very agitated.   LABORATORY FINDINGS:  His EKG, baseline  artifact.  I cannot assess the presence of P wave, however the QRS complexes are irregular.  This is likely atrial fibrillation.  Chest x-ray, mild cardiomegaly.  No effusion.  No consolidation.  His CBC showed white count of 21,000, hemoglobin 13, hematocrit 38, platelet count 137.  His troponin less than 0.02.  Serum glucose 116, B-type natriuretic peptide 1962, BUN 16, creatinine 1.8, sodium 137, potassium 3.7, estimated GFR is 32.  His urinalysis was clear, less than 1 white blood cells, 1 RBC, negative nitrite.   ASSESSMENT: 1.  The patient referred from nursing home with notification that the patient noticed to have some shortness of breath and increased leg edema.  2.  Leukocytosis.  The etiology is unclear.  Blood pressures were taken.  The patient was not started on an antibiotic for the time being.  3.  Hypertension, uncontrolled.  4.  Advanced Alzheimer dementia.  5.  Chronic venous stasis of legs.  6.  Chronic kidney disease stage 3.  7.  Hypothyroidism.  8. Atrial fibrillation  PLAN:  We will admit the patient and start IV Lasix 40 mg x 6 doses, then follow up on the basic metabolic profile tomorrow.  Hold Demadex.  Regarding the leukocytosis, I ordered blood cultures x 2.  We will repeat CBC tomorrow.  No antibiotic for the time being as no source of infection is identifiable so far.  In addition to the blood cultures, I will order urine culture as well even though the urinalysis appears to be clean.  Regarding his shortness of breath, I will order echocardiogram to update his data.  His last echocardiogram was in 2011 showing ejection fraction of 55%.  For deep vein thrombosis, I will place him on Lovenox 30 mg subcutaneous once a day.  Fall precautions.  We will place sitter in his room as he is not following any commands and trying to get out of bed.  Regarding LIVING WILL and CODE STATUS, this cannot be obtained from the patient due to his advanced dementia.  We do not have any records  regarding this issue in the past.   ADDENDUM: Later and prior to transfering patient to medical floor he spiked a tempreature 101. I ordered urine culture in addition to blood cultures. I am starting emperic use of Rocephin for now.   Time spent in evaluating this patient took more than 55 minutes.    ____________________________ Carney Corners. Rudene Re, MD amd:ea D: 04/30/2013 05:29:36 ET T: 04/30/2013 06:03:06 ET JOB#: [id number]  cc: Carney Corners. Rudene Re, MD, <Dictator> Zollie Scale MD ELECTRONICALLY SIGNED 04/30/2013 6:41
